# Patient Record
Sex: Female | Born: 1980 | Race: White | Hispanic: No | Marital: Single | Smoking: Current every day smoker
Health system: Southern US, Community
[De-identification: ages and names within clinical notes are randomized; demographics above are authoritative.]

## PROBLEM LIST (undated history)

## (undated) DIAGNOSIS — E119 Type 2 diabetes mellitus without complications: Secondary | ICD-10-CM

---

## 2017-12-02 ENCOUNTER — Other Ambulatory Visit: Payer: Self-pay

## 2017-12-02 ENCOUNTER — Encounter (HOSPITAL_COMMUNITY): Payer: Self-pay

## 2017-12-02 ENCOUNTER — Emergency Department (HOSPITAL_COMMUNITY): Payer: Medicaid - Out of State

## 2017-12-02 ENCOUNTER — Emergency Department (HOSPITAL_COMMUNITY)
Admission: EM | Admit: 2017-12-02 | Discharge: 2017-12-02 | Disposition: A | Discharge status: Home / Self Care | Payer: Medicaid - Out of State | Attending: Emergency Medicine | Admitting: Emergency Medicine

## 2017-12-02 DIAGNOSIS — O99331 Smoking (tobacco) complicating pregnancy, first trimester: Secondary | ICD-10-CM | POA: Diagnosis not present

## 2017-12-02 DIAGNOSIS — Z3A01 Less than 8 weeks gestation of pregnancy: Secondary | ICD-10-CM | POA: Diagnosis not present

## 2017-12-02 DIAGNOSIS — O200 Threatened abortion: Secondary | ICD-10-CM | POA: Diagnosis not present

## 2017-12-02 DIAGNOSIS — O24111 Pre-existing diabetes mellitus, type 2, in pregnancy, first trimester: Secondary | ICD-10-CM | POA: Insufficient documentation

## 2017-12-02 DIAGNOSIS — F17210 Nicotine dependence, cigarettes, uncomplicated: Secondary | ICD-10-CM | POA: Insufficient documentation

## 2017-12-02 DIAGNOSIS — O209 Hemorrhage in early pregnancy, unspecified: Secondary | ICD-10-CM | POA: Diagnosis present

## 2017-12-02 HISTORY — DX: Type 2 diabetes mellitus without complications: E11.9

## 2017-12-02 LAB — URINALYSIS, ROUTINE W REFLEX MICROSCOPIC
Bilirubin Urine: NEGATIVE
HGB URINE DIPSTICK: NEGATIVE
KETONES UR: NEGATIVE mg/dL
Leukocytes, UA: NEGATIVE
NITRITE: NEGATIVE
PH: 5 (ref 5.0–8.0)
Protein, ur: NEGATIVE mg/dL
Specific Gravity, Urine: 1.024 (ref 1.005–1.030)

## 2017-12-02 LAB — CBC WITH DIFFERENTIAL/PLATELET
ABS IMMATURE GRANULOCYTES: 0.02 10*3/uL (ref 0.00–0.07)
BASOS PCT: 1 %
Basophils Absolute: 0.1 10*3/uL (ref 0.0–0.1)
Eosinophils Absolute: 0.2 10*3/uL (ref 0.0–0.5)
Eosinophils Relative: 2 %
HCT: 44.4 % (ref 36.0–46.0)
Hemoglobin: 14.6 g/dL (ref 12.0–15.0)
IMMATURE GRANULOCYTES: 0 %
LYMPHS PCT: 28 %
Lymphs Abs: 2.3 10*3/uL (ref 0.7–4.0)
MCH: 30 pg (ref 26.0–34.0)
MCHC: 32.9 g/dL (ref 30.0–36.0)
MCV: 91.2 fL (ref 80.0–100.0)
MONOS PCT: 5 %
Monocytes Absolute: 0.4 10*3/uL (ref 0.1–1.0)
NEUTROS PCT: 64 %
Neutro Abs: 5.3 10*3/uL (ref 1.7–7.7)
PLATELETS: 273 10*3/uL (ref 150–400)
RBC: 4.87 MIL/uL (ref 3.87–5.11)
RDW: 13.9 % (ref 11.5–15.5)
WBC: 8.3 10*3/uL (ref 4.0–10.5)
nRBC: 0 % (ref 0.0–0.2)

## 2017-12-02 LAB — HCG, QUANTITATIVE, PREGNANCY: HCG, BETA CHAIN, QUANT, S: 30817 m[IU]/mL — AB (ref <5)

## 2017-12-02 LAB — BASIC METABOLIC PANEL
ANION GAP: 8 (ref 5–15)
BUN: 11 mg/dL (ref 6–20)
CO2: 27 mmol/L (ref 22–32)
Calcium: 9.6 mg/dL (ref 8.9–10.3)
Chloride: 103 mmol/L (ref 98–111)
Creatinine, Ser: 0.6 mg/dL (ref 0.44–1.00)
GFR calc Af Amer: >60 mL/min (ref >60)
GLUCOSE: 132 mg/dL — AB (ref 70–99)
POTASSIUM: 4.1 mmol/L (ref 3.5–5.1)
Sodium: 138 mmol/L (ref 135–145)

## 2017-12-02 LAB — WET PREP, GENITAL
Sperm: NONE SEEN
Trich, Wet Prep: NONE SEEN
YEAST WET PREP: NONE SEEN

## 2017-12-02 LAB — ABO/RH: ABO/RH(D): A POS

## 2017-12-02 LAB — POC URINE PREG, ED: Preg Test, Ur: POSITIVE — AB

## 2017-12-02 NOTE — Discharge Instructions (Signed)
Keep your scheduled appointment for prenatal care December 15, 2017.  Take Flintstones vitamins as directed.  Ask your OB doctor to help you to stop smoking

## 2017-12-02 NOTE — ED Triage Notes (Signed)
Pt reports is approx [redacted] weeks pregnant and last night she started cramping and spotting.

## 2017-12-02 NOTE — ED Provider Notes (Addendum)
[facility] EMERGENCY DEPARTMENT Provider Note   CSN: [id number] Arrival date & time: 12/02/17  1237     History   Chief Complaint Chief Complaint  Patient presents with  . Vaginal Bleeding    HPI Terri Bryan is a 37 y.o. female.  Developed lower abdominal cramping yesterday and vaginal spotting last night.  No other complaint.  She is presently [redacted] weeks pregnant by dates.  Last normal menstrual period October 28, 2017.  No treatment prior to coming here.  No other associated symptoms.  Nothing makes symptoms better or worse  HPI  Past Medical History:  Diagnosis Date  . Diabetes mellitus without complication (HCC)     There are no active problems to display for this patient.   History reviewed. No pertinent surgical history.   OB History    Gravida  1   Para      Term      Preterm      AB      Living        SAB      TAB      Ectopic      Multiple      Live Births            Gravida 6 para 3-1-1-4, with 3 term vaginal deliveries one preterm vaginal delivery, one therapeutic abortion.   Home Medications    Prior to Admission medications   Not on File    Family History No family history on file.  Social History Social History   Tobacco Use  . Smoking status: Current Every Day Smoker  . Smokeless tobacco: Never Used  Substance Use Topics  . Alcohol use: Never    Frequency: Never  . Drug use: Never     Allergies   Amoxicillin; Penicillins; and Ultram [tramadol hcl]   Review of Systems Review of Systems  Constitutional: Negative.   HENT: Negative.   Respiratory: Negative.   Cardiovascular: Negative.   Gastrointestinal: Negative.        Lower abdominal cramping  Genitourinary: Positive for vaginal bleeding.       Pregnant  Musculoskeletal: Negative.   Skin: Negative.   Allergic/Immunologic: Positive for immunocompromised state.       Diabetic  Neurological: Negative.   Psychiatric/Behavioral: Negative.   All other  systems reviewed and are negative.    Physical Exam Updated Vital Signs BP 107/83   Pulse (!) 104   Temp 98.3 F (36.8 C) (Oral)   Resp 16   Ht 5\' 2"  (1.575 m)   Wt 52.2 kg   LMP 10/28/2017   SpO2 100%   BMI 21.03 kg/m   Physical Exam  Constitutional: She appears well-developed and well-nourished. No distress.  Edentulous  HENT:  Head: Normocephalic and atraumatic.  Eyes: Pupils are equal, round, and reactive to light. Conjunctivae are normal.  Neck: Neck supple. No tracheal deviation present. No thyromegaly present.  Cardiovascular: Regular rhythm.  No murmur heard. Tachycardic, slight  Pulmonary/Chest: Effort normal and breath sounds normal.  Abdominal: Soft. Bowel sounds are normal. She exhibits no distension. There is tenderness.  Minimally tender at suprapubic area  Genitourinary:  Genitourinary Comments: Pelvic exam no external lesion.  Yellowish discharge in vault coming from loss.  Cervical loss closed.  No cervical motion tenderness.  No adnexal masses or tenderness  Musculoskeletal: Normal range of motion. She exhibits no edema or tenderness.  Neurological: She is alert. Coordination normal.  Skin: Skin is warm and dry. No rash noted.  Psychiatric: She has a normal mood and affect.  Nursing note and vitals reviewed.    ED Treatments / Results  Labs (all labs ordered are listed, but only abnormal results are displayed) Labs Reviewed  WET PREP, GENITAL  URINALYSIS, ROUTINE W REFLEX MICROSCOPIC  HCG, QUANTITATIVE, PREGNANCY  BASIC METABOLIC PANEL  CBC WITH DIFFERENTIAL/PLATELET  RPR  HIV ANTIBODY (ROUTINE TESTING W REFLEX)  POC URINE PREG, ED  ABO/RH  GC/CHLAMYDIA PROBE AMP ([place]) NOT AT [facility]    EKG None  Radiology No results found.  Procedures Procedures (including critical care time)  Medications Ordered in ED Medications - No data to display   Initial Impression / Assessment and Plan / ED Course  I have reviewed the triage vital  signs and the nursing notes.  Pertinent labs & imaging results that were available during my care of the patient were reviewed by me and considered in my medical decision making (see chart for details).     4:40 PM patient resting comfortably.  No distress Results for orders placed or performed during the hospital encounter of 12/02/17  Wet prep, genital  Result Value Ref Range   Yeast Wet Prep HPF POC NONE SEEN NONE SEEN   Trich, Wet Prep NONE SEEN NONE SEEN   Clue Cells Wet Prep HPF POC PRESENT (A) NONE SEEN   WBC, Wet Prep HPF POC FEW (A) NONE SEEN   Sperm NONE SEEN   Urinalysis, Routine w reflex microscopic  Result Value Ref Range   Color, Urine YELLOW YELLOW   APPearance CLEAR CLEAR   Specific Gravity, Urine 1.024 1.005 - 1.030   pH 5.0 5.0 - 8.0   Glucose, UA >=500 (A) NEGATIVE mg/dL   Hgb urine dipstick NEGATIVE NEGATIVE   Bilirubin Urine NEGATIVE NEGATIVE   Ketones, ur NEGATIVE NEGATIVE mg/dL   Protein, ur NEGATIVE NEGATIVE mg/dL   Nitrite NEGATIVE NEGATIVE   Leukocytes, UA NEGATIVE NEGATIVE   RBC / HPF 0-5 0 - 5 RBC/hpf   WBC, UA 0-5 0 - 5 WBC/hpf   Bacteria, UA RARE (A) NONE SEEN   Squamous Epithelial / LPF 0-5 0 - 5   Mucus PRESENT   hCG, quantitative, pregnancy  Result Value Ref Range   hCG, Beta Chain, Quant, S 30,817 (H) <5 mIU/mL  Basic metabolic panel  Result Value Ref Range   Sodium 138 135 - 145 mmol/L   Potassium 4.1 3.5 - 5.1 mmol/L   Chloride 103 98 - 111 mmol/L   CO2 27 22 - 32 mmol/L   Glucose, Bld 132 (H) 70 - 99 mg/dL   BUN 11 6 - 20 mg/dL   Creatinine, Ser 1.47 0.44 - 1.00 mg/dL   Calcium 9.6 8.9 - 82.9 mg/dL   GFR calc non Af Amer >60 >60 mL/min   GFR calc Af Amer >60 >60 mL/min   Anion gap 8 5 - 15  CBC with Differential/Platelet  Result Value Ref Range   WBC 8.3 4.0 - 10.5 K/uL   RBC 4.87 3.87 - 5.11 MIL/uL   Hemoglobin 14.6 12.0 - 15.0 g/dL   HCT 56.2 13.0 - 86.5 %   MCV 91.2 80.0 - 100.0 fL   MCH 30.0 26.0 - 34.0 pg   MCHC 32.9  30.0 - 36.0 g/dL   RDW 78.4 69.6 - 29.5 %   Platelets 273 150 - 400 K/uL   nRBC 0.0 0.0 - 0.2 %   Neutrophils Relative % 64 %   Neutro Abs 5.3 1.7 -  7.7 K/uL   Lymphocytes Relative 28 %   Lymphs Abs 2.3 0.7 - 4.0 K/uL   Monocytes Relative 5 %   Monocytes Absolute 0.4 0.1 - 1.0 K/uL   Eosinophils Relative 2 %   Eosinophils Absolute 0.2 0.0 - 0.5 K/uL   Basophils Relative 1 %   Basophils Absolute 0.1 0.0 - 0.1 K/uL   Immature Granulocytes 0 %   Abs Immature Granulocytes 0.02 0.00 - 0.07 K/uL  POC Urine Pregnancy, ED (do NOT order at [facility])  Result Value Ref Range   Preg Test, Ur POSITIVE (A) NEGATIVE  ABO/Rh  Result Value Ref Range   ABO/RH(D)      A POS Performed at [facility], [address] [place], [place] [address]    US Ob Comp < 14 Wks  Result Date: 12/02/2017 CLINICAL DATA:  Pregnant patient with vaginal bleeding. EXAM: OBSTETRIC <14 WK [place] AND TRANSVAGINAL OB US TECHNIQUE: Both transabdominal and transvaginal ultrasound examinations were performed for complete evaluation of the gestation as well as the maternal uterus, adnexal regions, and pelvic cul-de-sac. Transvaginal technique was performed to assess early pregnancy. COMPARISON:  None. FINDINGS: Intrauterine gestational sac: Single Yolk sac:  Visualized. Embryo:  Visualized. Cardiac Activity: Visualized. Heart Rate: 163 bpm CRL:  9.1 mm   6 w   6 d                  [place] EDC: 07/22/2018 Subchorionic hemorrhage:  Small Maternal uterus/adnexae: The ovaries are not visualized. No free fluid in the pelvis. IMPRESSION: Single live intrauterine gestation.  Small subchorionic hemorrhage. Electronically Signed   By: Annia Belt M.D.   On: 12/02/2017 15:45   US Ob Transvaginal  Result Date: 12/02/2017 CLINICAL DATA:  Pregnant patient with vaginal bleeding. EXAM: OBSTETRIC <14 WK [place] AND TRANSVAGINAL OB US TECHNIQUE: Both transabdominal and transvaginal ultrasound examinations were performed for complete evaluation of the gestation as  well as the maternal uterus, adnexal regions, and pelvic cul-de-sac. Transvaginal technique was performed to assess early pregnancy. COMPARISON:  None. FINDINGS: Intrauterine gestational sac: Single Yolk sac:  Visualized. Embryo:  Visualized. Cardiac Activity: Visualized. Heart Rate: 163 bpm CRL:  9.1 mm   6 w   6 d                  [place] EDC: 07/22/2018 Subchorionic hemorrhage:  Small Maternal uterus/adnexae: The ovaries are not visualized. No free fluid in the pelvis. IMPRESSION: Single live intrauterine gestation.  Small subchorionic hemorrhage. Electronically Signed   By: Annia Belt M.D.   On: 12/02/2017 15:45    No Clinical evidence or laboratory evidence of infection. She reports that with past pregnancies prenatal vitamins made her ill.  I suggest she take Flintstones vitamins.  Plan bed rest pelvic rest.  Keep scheduled appointment for first prenatal visit December 15, 2017 . I counseled patient for 5 minutes on smoking cessation Final Clinical Impressions(s) / ED Diagnoses  Diagnosis threatened abortion Final diagnoses:  None   #2 tobacco abuse ED Discharge Orders    None       Doug Sou, MD 12/02/17 1647    Doug Sou, MD 12/02/17 [phone]

## 2017-12-03 LAB — GC/CHLAMYDIA PROBE AMP (~~LOC~~) NOT AT ARMC
Chlamydia: NEGATIVE
NEISSERIA GONORRHEA: NEGATIVE

## 2017-12-03 LAB — RPR: RPR Ser Ql: NONREACTIVE

## 2017-12-03 LAB — HIV ANTIBODY (ROUTINE TESTING W REFLEX): HIV Screen 4th Generation wRfx: NONREACTIVE
